# Patient Record
Sex: Male | Born: 2005 | Race: White | Hispanic: No | Marital: Single | Smoking: Never smoker
Health system: Southern US, Community
[De-identification: ages and names within clinical notes are randomized; demographics above are authoritative.]

## PROBLEM LIST (undated history)

## (undated) DIAGNOSIS — R569 Unspecified convulsions: Secondary | ICD-10-CM

---
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
## 2023-01-16 ENCOUNTER — Telehealth: Payer: Self-pay | Admitting: Family

## 2023-01-16 NOTE — Telephone Encounter (Signed)
Called to let patient know that we had a sports physical form from 12/01/22

## 2023-12-07 ENCOUNTER — Ambulatory Visit: Payer: Self-pay

## 2023-12-07 NOTE — Telephone Encounter (Signed)
 FYI Only or Action Required?: FYI only for provider.  Patient was last seen in primary care on 08/04/2022 by Lavell Bari LABOR, FNP. Called Nurse Triage reporting Depression. Symptoms began several weeks ago. Interventions attempted: Rest, hydration, or home remedies. Symptoms are: unchanged.  Triage Disposition: See Physician Within 24 Hours  Patient/caregiver understands and will follow disposition?: Yes  Copied from CRM [id number]. Topic: Clinical - Red Word Triage >> Dec 07, 2023 11:06 AM Miguel Curry wrote: Red Word that prompted transfer to Nurse Triage: anxiety attack. Patient is dealing with negative thoughts and lack of sleep Reason for Disposition  [1] Depression AND [2] worsening (e.g., sleeping poorly, less able to do activities of daily living)  Answer Assessment - Initial Assessment Questions 1. CONCERN: What happened that made you call today?     Patient reports that having negative thoughts and high stress levels 2. DEPRESSION SYMPTOM SCREENING: How are you feeling overall? (e.g., decreased energy, increased sleeping or difficulty sleeping, difficulty concentrating, feelings of sadness, guilt, hopelessness, or worthlessness)     Lack of energy, not sleeping well,  3. RISK OF HARM - SUICIDAL IDEATION:  Do you ever have thoughts of hurting or killing yourself?  (e.g., yes, no, no but preoccupation with thoughts about death)   - INTENT:  Do you have thoughts of hurting or killing yourself right NOW? (e.g., yes, no, N/A)   - PLAN: Do you have a specific plan for how you would do this? (e.g., gun, knife, overdose, no plan, N/A)     no 4. RISK OF HARM - HOMICIDAL IDEATION:  Do you ever have thoughts of hurting or killing someone else?  (e.g., yes, no, no but preoccupation with thoughts about death)   - INTENT:  Do you have thoughts of hurting or killing someone right NOW? (e.g., yes, no, N/A)   - PLAN: Do you have a specific plan for how you would do this? (e.g., gun,  knife, no plan, N/A)      no 5. FUNCTIONAL IMPAIRMENT: How have things been going for you overall? Have you had more difficulty than usual doing your normal daily activities?  (e.g., better, same, worse; self-care, school, work, interactions)     yes 6. SUPPORT: Who is with you now? Who do you live with? Do you have family or friends who you can talk to?      Patient is with his grandmother and friends that he can speak to 7. THERAPIST: Do you have a counselor or therapist? Name?     N/A 8. STRESSORS: Has there been any new stress or recent changes in your life?     No new stressors 9. ALCOHOL USE OR SUBSTANCE USE (DRUG USE): Do you drink alcohol or use any illegal drugs?     no 10. OTHER: Do you have any other physical symptoms right now? (e.g., fever)       No  Patient calling with the objective of getting an appointment to be seen by PCP. Patient given information for [facility] located in his county. Address given to patient. Appointment made with PCP tomorrow for acute visit.  Protocols used: Depression-A-AH

## 2023-12-08 ENCOUNTER — Ambulatory Visit (INDEPENDENT_AMBULATORY_CARE_PROVIDER_SITE_OTHER): Admitting: Family

## 2023-12-08 ENCOUNTER — Encounter: Payer: Self-pay | Admitting: Family

## 2023-12-08 VITALS — BP 107/67 | HR 113 | Temp 98.0°F | Ht 71.3 in | Wt 215.8 lb

## 2023-12-08 DIAGNOSIS — Z0001 Encounter for general adult medical examination with abnormal findings: Secondary | ICD-10-CM

## 2023-12-08 DIAGNOSIS — Z0000 Encounter for general adult medical examination without abnormal findings: Secondary | ICD-10-CM

## 2023-12-08 DIAGNOSIS — E663 Overweight: Secondary | ICD-10-CM | POA: Diagnosis not present

## 2023-12-08 DIAGNOSIS — F321 Major depressive disorder, single episode, moderate: Secondary | ICD-10-CM | POA: Diagnosis not present

## 2023-12-08 DIAGNOSIS — Z6829 Body mass index (BMI) 29.0-29.9, adult: Secondary | ICD-10-CM

## 2023-12-08 MED ORDER — ESCITALOPRAM OXALATE 10 MG PO TABS: 10.0000 mg | ORAL_TABLET | Freq: Every day | ORAL | 1 refills | Status: DC

## 2023-12-08 NOTE — Progress Notes (Signed)
 Subjective:    Patient ID: Miguel Curry, male    DOB: Apr 19, 2006, 17 y.o.   MRN: [id number]  Chief Complaint  Patient presents with   Depression   Pt presents to the office today for CPE. Pt complaining of depression on going for years, but has worsen to the point that he is isolating himself.   He has done counseling through his school.  Depression        This is a new problem.  The current episode started more than 1 year ago.   The onset quality is gradual.   Associated symptoms include decreased concentration, fatigue, helplessness, hopelessness, insomnia, restlessness, decreased interest and sad.  Associated symptoms include no suicidal ideas.     The symptoms are aggravated by family issues.  Past treatments include nothing.  Past medical history includes anxiety.   Anxiety Presents for initial visit. Symptoms include decreased concentration, depressed mood, excessive worry, insomnia, nervous/anxious behavior, panic and restlessness. Patient reports no obsessions or suicidal ideas.        Review of Systems  Constitutional:  Positive for fatigue.  Psychiatric/Behavioral:  Positive for decreased concentration and depression. Negative for suicidal ideas. The patient is nervous/anxious and has insomnia.   All other systems reviewed and are negative.   Social History   Socioeconomic History   Marital status: Single    Spouse name: Not on file   Number of children: Not on file   Years of education: Not on file   Highest education level: Not on file  Occupational History   Not on file  Tobacco Use   Smoking status: Never   Smokeless tobacco: Never  Substance and Sexual Activity   Alcohol use: No   Drug use: No   Sexual activity: Not on file  Other Topics Concern   Not on file  Social History Narrative   Not on file   Social Drivers of Health   Financial Resource Strain: Not on file  Food Insecurity: Not on file  Transportation Needs: Not on file  Physical  Activity: Not on file  Stress: Not on file  Social Connections: Not on file   History reviewed. No pertinent family history.      Objective:   Physical Exam Vitals reviewed.  Constitutional:      General: He is not in acute distress.    Appearance: He is well-developed.  HENT:     Head: Normocephalic.     Right Ear: Tympanic membrane and external ear normal.     Left Ear: Tympanic membrane and external ear normal.   Eyes:     General:        Right eye: No discharge.        Left eye: No discharge.     Pupils: Pupils are equal, round, and reactive to light.   Neck:     Thyroid: No thyromegaly.   Cardiovascular:     Rate and Rhythm: Normal rate and regular rhythm.     Heart sounds: Normal heart sounds. No murmur heard. Pulmonary:     Effort: Pulmonary effort is normal. No respiratory distress.     Breath sounds: Normal breath sounds. No wheezing.  Abdominal:     General: Bowel sounds are normal. There is no distension.     Palpations: Abdomen is soft.     Tenderness: There is no abdominal tenderness.   Musculoskeletal:        General: No tenderness. Normal range of motion.     Cervical  back: Normal range of motion and neck supple.   Skin:    General: Skin is warm and dry.     Findings: No erythema or rash.   Neurological:     Mental Status: He is alert and oriented to person, place, and time.     Cranial Nerves: No cranial nerve deficit.     Deep Tendon Reflexes: Reflexes are normal and symmetric.   Psychiatric:        Behavior: Behavior normal.        Thought Content: Thought content normal.        Judgment: Judgment normal.       BP 107/67   Pulse (!) 113   Temp 98 F (36.7 C) (Temporal)   Ht 5' 11.3 (1.811 m)   Wt 215 lb 12.8 oz (97.9 kg)   SpO2 96%   BMI 29.85 kg/m      Assessment & Plan:  Miguel Curry comes in today with chief complaint of Depression   Diagnosis and orders addressed:  1. Annual physical exam (Primary)  2. Depression,  major, single episode, moderate (HCC) Start lexapro 10 mg  Stress management  Continue therapy  Possible adverse effects discussed  - escitalopram (LEXAPRO) 10 MG tablet; Take 1 tablet (10 mg total) by mouth daily.  Dispense: 90 tablet; Refill: 1  3. Overweight (BMI 25.0-29.9)   Labs refused today Health Maintenance reviewed Diet and exercise encouraged  No follow-ups on file.    Bari Learn, FNP

## 2023-12-08 NOTE — Patient Instructions (Signed)

## 2024-01-04 ENCOUNTER — Ambulatory Visit: Payer: Self-pay

## 2024-01-04 ENCOUNTER — Other Ambulatory Visit: Payer: Self-pay | Admitting: Family

## 2024-01-04 MED ORDER — ESCITALOPRAM OXALATE 20 MG PO TABS: 20.0000 mg | ORAL_TABLET | Freq: Every day | ORAL | 1 refills | Status: DC

## 2024-01-04 MED ORDER — ESCITALOPRAM OXALATE 20 MG PO TABS: 20.0000 mg | ORAL_TABLET | Freq: Every day | ORAL | 5 refills | Status: DC

## 2024-01-04 NOTE — Telephone Encounter (Signed)
Lexapro 20 mg Prescription sent to pharmacy   

## 2024-01-04 NOTE — Telephone Encounter (Signed)
 FYI Only or Action Required?: Action required by provider: Pt is feeling better since starting medication, but hisnks dose need to be increased.  Patient was last seen in primary care on 12/08/2023 by Lavell Bari LABOR, FNP.  Called Nurse Triage reporting Depression.  Symptoms began ongoing.  Interventions attempted: Other: seen PCP.  Symptoms are: stable.  Triage Disposition: Home Care  Patient/caregiver understands and will follow disposition?: Yes                Copied from CRM [id number]. Topic: Clinical - Red Word Triage >> Jan 04, 2024 10:26 AM Tobias L wrote: Red Word that prompted transfer to Nurse Triage: negative thoughts, issues sleeping Reason for Disposition  [1] Depression AND [2] NO worsening or change  Answer Assessment - Initial Assessment Questions 1. CONCERN: What happened that made you call today?     Wanted a follow up appt 2. DEPRESSION SYMPTOM SCREENING: How are you feeling overall? (e.g., decreased energy, increased sleeping or difficulty sleeping, difficulty concentrating, feelings of sadness, guilt, hopelessness, or worthlessness)     Trouble sleeping 3. RISK OF HARM - SUICIDAL IDEATION:  Do you ever have thoughts of hurting or killing yourself?  (e.g., yes, no, no but preoccupation with thoughts about death)     no 4. RISK OF HARM - HOMICIDAL IDEATION:  Do you ever have thoughts of hurting or killing someone else?  (e.g., yes, no, no but preoccupation with thoughts about death)     no 5. FUNCTIONAL IMPAIRMENT: How have things been going for you overall? Have you had more difficulty than usual doing your normal daily activities?  (e.g., better, same, worse; self-care, school, work, interactions)     A little better 6. SUPPORT: Who is with you now? Who do you live with? Do you have family or friends who you can talk to?      Has friends and a therapist  8. STRESSORS: Has there been any new stress or recent changes in your life?      no 9. ALCOHOL USE OR SUBSTANCE USE (DRUG USE): Do you drink alcohol or use any illegal drugs?     no 10. OTHER: Do you have any other physical symptoms right now? (e.g., fever)       Issues sleeping  Protocols used: Depression-A-AH

## 2024-01-04 NOTE — Telephone Encounter (Signed)
 Patient aware and verbalized understanding.

## 2024-01-04 NOTE — Addendum Note (Signed)
 Addended by: LAVELL LYE A on: 01/04/2024 04:31 PM   Modules accepted: Orders

## 2024-01-29 ENCOUNTER — Ambulatory Visit (INDEPENDENT_AMBULATORY_CARE_PROVIDER_SITE_OTHER): Admitting: Family

## 2024-01-29 ENCOUNTER — Encounter: Payer: Self-pay | Admitting: Family

## 2024-01-29 VITALS — BP 112/78 | HR 85 | Temp 98.2°F | Ht 71.5 in | Wt 200.2 lb

## 2024-01-29 DIAGNOSIS — F411 Generalized anxiety disorder: Secondary | ICD-10-CM | POA: Diagnosis not present

## 2024-01-29 DIAGNOSIS — F324 Major depressive disorder, single episode, in partial remission: Secondary | ICD-10-CM

## 2024-01-29 MED ORDER — ESCITALOPRAM OXALATE 20 MG PO TABS: 20.0000 mg | ORAL_TABLET | Freq: Every day | ORAL | 2 refills | Status: AC

## 2024-01-29 NOTE — Progress Notes (Signed)
 Subjective:    Patient ID: Miguel Curry, male    DOB: [birth year], 18 y.o.   MRN: [id number]  Chief Complaint  Patient presents with   discuss meds   PT presents to the office today to follow up on depression. He was started on Lexapro 10 mg and states he is feeling better and can tell a difference. States he does feel like this should be increased.  Depression        This is a chronic problem.  The current episode started more than 1 year ago.   The problem occurs intermittently.  The problem has been gradually improving since onset.  Associated symptoms include fatigue, helplessness (improving), hopelessness (improving), restlessness and sad.  Associated symptoms include no decreased interest.  Past treatments include SSRIs - Selective serotonin reuptake inhibitors.  Past medical history includes anxiety.   Anxiety Presents for follow-up visit. Symptoms include excessive worry, nervous/anxious behavior and restlessness. Symptoms occur occasionally.        Review of Systems  Constitutional:  Positive for fatigue.  Psychiatric/Behavioral:  Positive for depression. The patient is nervous/anxious.   All other systems reviewed and are negative.   Social History   Socioeconomic History   Marital status: Single    Spouse name: Not on file   Number of children: Not on file   Years of education: Not on file   Highest education level: Not on file  Occupational History   Not on file  Tobacco Use   Smoking status: Never   Smokeless tobacco: Never  Substance and Sexual Activity   Alcohol use: No   Drug use: No   Sexual activity: Not on file  Other Topics Concern   Not on file  Social History Narrative   Not on file   Social Drivers of Health   Financial Resource Strain: Not on file  Food Insecurity: Not on file  Transportation Needs: Not on file  Physical Activity: Not on file  Stress: Not on file  Social Connections: Not on file   No family history on file.       Objective:   Physical Exam Vitals reviewed.  Constitutional:      General: He is not in acute distress.    Appearance: He is well-developed.  HENT:     Head: Normocephalic.  Eyes:     General:        Right eye: No discharge.        Left eye: No discharge.     Pupils: Pupils are equal, round, and reactive to light.  Neck:     Thyroid: No thyromegaly.  Cardiovascular:     Rate and Rhythm: Normal rate and regular rhythm.     Heart sounds: Normal heart sounds. No murmur heard. Pulmonary:     Effort: Pulmonary effort is normal. No respiratory distress.     Breath sounds: Normal breath sounds. No wheezing.  Abdominal:     General: Bowel sounds are normal. There is no distension.     Palpations: Abdomen is soft.     Tenderness: There is no abdominal tenderness.  Musculoskeletal:        General: No tenderness. Normal range of motion.     Cervical back: Normal range of motion and neck supple.  Skin:    General: Skin is warm and dry.     Findings: No erythema or rash.  Neurological:     Mental Status: He is alert and oriented to person, place, and time.  Cranial Nerves: No cranial nerve deficit.     Deep Tendon Reflexes: Reflexes are normal and symmetric.  Psychiatric:        Behavior: Behavior normal.        Thought Content: Thought content normal.        Judgment: Judgment normal.       BP 112/78   Pulse 85   Temp 98.2 F (36.8 C) (Temporal)   Ht 5' 11.5 (1.816 m)   Wt 200 lb 3.2 oz (90.8 kg)   BMI 27.53 kg/m      Assessment & Plan:  Miguel Curry comes in today with chief complaint of discuss meds   Diagnosis and orders addressed:  1. Depression, major, single episode, in partial remission (HCC) (Primary) - escitalopram (LEXAPRO) 20 MG tablet; Take 1 tablet (20 mg total) by mouth daily.  Dispense: 90 tablet; Refill: 2  2. GAD (generalized anxiety disorder) - escitalopram (LEXAPRO) 20 MG tablet; Take 1 tablet (20 mg total) by mouth daily.  Dispense: 90  tablet; Refill: 2  Increase Lexapro 20 mg from 10 mg Stress management  Follow up in 1 year as long as everything is stable    Bari Learn, FNP

## 2024-01-29 NOTE — Patient Instructions (Signed)
